# Patient Record
Sex: Female | Born: 1985 | Race: White | Hispanic: No | Marital: Married | Smoking: Former smoker
Health system: Southern US, Community
[De-identification: ages and names within clinical notes are randomized; demographics above are authoritative.]

## PROBLEM LIST (undated history)

## (undated) DIAGNOSIS — Z789 Other specified health status: Secondary | ICD-10-CM

## (undated) HISTORY — DX: Other specified health status: Z78.9

## (undated) HISTORY — PX: NO PAST SURGERIES: SHX2092

---

## 2015-12-03 ENCOUNTER — Encounter: Payer: Self-pay | Admitting: *Deleted

## 2015-12-04 ENCOUNTER — Encounter: Payer: Self-pay | Admitting: *Deleted

## 2015-12-13 ENCOUNTER — Encounter: Payer: Self-pay | Admitting: Family

## 2015-12-13 ENCOUNTER — Ambulatory Visit (INDEPENDENT_AMBULATORY_CARE_PROVIDER_SITE_OTHER): Admitting: Family

## 2015-12-13 VITALS — BP 120/74 | HR 75 | Ht 65.0 in | Wt 195.0 lb

## 2015-12-13 DIAGNOSIS — Z113 Encounter for screening for infections with a predominantly sexual mode of transmission: Secondary | ICD-10-CM

## 2015-12-13 DIAGNOSIS — Z3483 Encounter for supervision of other normal pregnancy, third trimester: Secondary | ICD-10-CM

## 2015-12-13 DIAGNOSIS — Z3493 Encounter for supervision of normal pregnancy, unspecified, third trimester: Secondary | ICD-10-CM

## 2015-12-13 DIAGNOSIS — Z363 Encounter for antenatal screening for malformations: Secondary | ICD-10-CM

## 2015-12-13 DIAGNOSIS — Z3480 Encounter for supervision of other normal pregnancy, unspecified trimester: Secondary | ICD-10-CM

## 2015-12-13 DIAGNOSIS — Z3490 Encounter for supervision of normal pregnancy, unspecified, unspecified trimester: Secondary | ICD-10-CM | POA: Insufficient documentation

## 2015-12-13 LAB — POCT URINALYSIS DIP (DEVICE)
BILIRUBIN URINE: NEGATIVE
Glucose, UA: NEGATIVE mg/dL
HGB URINE DIPSTICK: NEGATIVE
KETONES UR: 15 mg/dL — AB
Nitrite: NEGATIVE
Protein, ur: NEGATIVE mg/dL
SPECIFIC GRAVITY, URINE: 1.015 (ref 1.005–1.030)
Urobilinogen, UA: 0.2 mg/dL (ref 0.0–1.0)
pH: 7 (ref 5.0–8.0)

## 2015-12-13 LAB — OB RESULTS CONSOLE GBS: STREP GROUP B AG: NEGATIVE

## 2015-12-13 NOTE — Addendum Note (Signed)
Addended by: Garret ReddishBARNES, Meghen Akopyan M on: 12/13/2015 03:53 PM   Modules accepted: Orders

## 2015-12-13 NOTE — Patient Instructions (Addendum)
AREA PED[facility]ATRIC/FAMILY [place] [address], [address], [place]  [address] Phone - [phone]   Fax - [phone]  [facility] [address] [place], [place] [address] Phone - [phone]   Fax - [place] [address] [place], [place]  [address] Phone - [phone]   Fax - [phone]  [place] [address] [place] [address] [place], [place]  [address] Phone - [phone]   Fax - [phone]  [place] [address] [place], [place]  [address] Phone - [phone]   Fax - [phone]  [facility] [address], [address] [place], [place]  [address] Phone - [phone]   Fax - [place] [address] [place], [place]  [address] Phone - [phone]   Fax - [phone]  [facility] [address] [place], [place] [address] [place], [place]  [address] Phone - [phone]   Fax - [place] [address] [place], [place]  [address] Phone - [phone]   Fax - [phone] [facility] [place]. [address] [place], [place]  [address] Phone - [phone]   Fax - [phone]  [place] [address] [place], [place]  [address] Phone - [phone]   Fax - [phone]  [facility] [place], [place], [place]  [address] Phone - [phone]   Fax - [place] [address], [place], [place]  [address] Phone - [phone]   Fax - [phone]  [facility] [address], [place], [place]  [address] Phone - [place], [place]  [address] Phone - [phone]   Fax - [place] [address] [place], [place]  [address] Phone - [phone]   Fax - [phone]  [place] [address] [place], [place]  [address] Phone - [phone]   Fax - [place]. [place], [place]  [address] Phone - [phone]   Fax - [place], [place], [place]  [address] Phone - [phone]   Fax - [place] [address], [place], [place]  [address] Phone - [phone]   Fax - [phone]  [facility] [address], [place], [place]  [address] Phone - [phone]   Fax - [place] [address] [place], [place]  [address] Phone - [phone]   Fax - [phone]  [place] [address] [place], [place]  [address] Phone - [phone]   Fax - [phone] [place] [address] [place], [place]  [address] Phone - [phone]   Fax - [phone]  [place] [address] [place], [place]  [address] Phone - [phone]   Fax - [place] [address] [place],   [address] Phone - [phone]   Fax - [phone]  Thinking About Doren Custard???  You must attend a Doren Custard class at [facility]  3rd Wednesday of every month from 7-9pm  Free  Register by calling [phone] or online at VFederal.at  Bring  [place] the certificate from the class  Waterbirth supplies needed for [facility] patients:  Our practice has a Heritage manager in a Box tub at the hospital that you can borrow  You will need to purchase an accessory kit that has all needed supplies through [facility] [phone]) or online $175.00, will need to pay for liner separately   Or you can purchase the supplies  separately: o Single-use disposable tub liner for Birth Pool in a Box (REGULAR size) o New garden hose labeled "lead-free", "suitable for drinking water", o Electric drain pump to remove water (We recommend 792 gallon per hour or greater pump.)  o  "non-toxic" OR "water potable" o Garden hose to remove the dirty water o Fish net o Bathing suit top (optional) o Long-handled mirror (optional)  [employer] sells tubs for ~ $120 if you would rather purchase your own tub.  They also sell accessories, liners.    Www.waterbirthsolutions.com for tub purchases and supplies  The Labor Ladies (www.thelaborladies.com) $275 for tub rental/set-up & take down/kit   [employer] information regarding doulas (labor support) who provide pool rentals:  IdentityList.se.htm   The Labor Ladies (www.thelaborladies.com)  IdentityList.se.htm   Things that would prevent you from having a waterbirth:  Premature, <37wks  Previous cesarean birth  Presence of thick meconium-stained fluid  Multiple gestation (Twins, triplets, etc.)  Uncontrolled diabetes or gestational diabetes requiring medication  Hypertension  Heavy vaginal bleeding  Non-reassuring fetal heart rate  Active infection (MRSA, etc.)  If your labor has to be induced and induction method requires continuous monitoring of the baby's heart rate  Other risks/issues identified by your obstetrical provider    Third Trimester of Pregnancy The third trimester is from week 29 through week 40 (months 7 through 9). The third trimester is a time when the unborn baby (fetus) is growing rapidly. At the end of the ninth month, the fetus is about 20 inches in length and weighs 6-10 pounds. Body changes during your third trimester Your body goes through many changes during pregnancy. The changes vary from woman to woman. During the third trimester:  Your weight will continue to increase. You  can expect to gain 25-35 pounds (11-16 kg) by the end of the pregnancy.  You may begin to get stretch marks on your hips, abdomen, and breasts.  You may urinate more often because the fetus is moving lower into your pelvis and pressing on your bladder.  You may develop or continue to have heartburn. This is caused by increased hormones that slow down muscles in the digestive tract.  You may develop or continue to have constipation because increased hormones slow digestion and cause the muscles that push waste through your intestines to relax.  You may develop hemorrhoids. These are swollen veins (varicose veins) in the rectum that can itch or be painful.  You may develop swollen, bulging veins (varicose veins) in your legs.  You may have increased body aches in the pelvis, back, or thighs. This is due to weight gain and increased hormones that are relaxing your joints.  You may have changes in your hair. These can include thickening of your hair, rapid growth, and changes in texture. Some women also have hair loss during or after pregnancy, or hair that feels dry or thin. Your hair will most likely return to normal after your baby is born.  Your breasts will continue to grow and they will continue to become tender. A yellow fluid (colostrum) may  leak from your breasts. This is the first milk you are producing for your baby.  Your belly button may stick out.  You may notice more swelling in your hands, face, or ankles.  You may have increased tingling or numbness in your hands, arms, and legs. The skin on your belly may also feel numb.  You may feel short of breath because of your expanding uterus.  You may have more problems sleeping. This can be caused by the size of your belly, increased need to urinate, and an increase in your body's metabolism.  You may notice the fetus "dropping," or moving lower in your abdomen.  You may have increased vaginal discharge.  Your cervix becomes  thin and soft (effaced) near your due date. What to expect at prenatal visits You will have prenatal exams every 2 weeks until week 36. Then you will have weekly prenatal exams. During a routine prenatal visit:  You will be weighed to make sure you and the fetus are growing normally.  Your blood pressure will be taken.  Your abdomen will be measured to track your baby's growth.  The fetal heartbeat will be listened to.  Any test results from the previous visit will be discussed.  You may have a cervical check near your due date to see if you have effaced. At around 36 weeks, your health care provider will check your cervix. At the same time, your health care provider will also perform a test on the secretions of the vaginal tissue. This test is to determine if a type of bacteria, Group B streptococcus, is present. Your health care provider will explain this further. Your health care provider may ask you:  What your birth plan is.  How you are feeling.  If you are feeling the baby move.  If you have had any abnormal symptoms, such as leaking fluid, bleeding, severe headaches, or abdominal cramping.  If you are using any tobacco products, including cigarettes, chewing tobacco, and electronic cigarettes.  If you have any questions. Other tests or screenings that may be performed during your third trimester include:  Blood tests that check for low iron levels (anemia).  Fetal testing to check the health, activity level, and growth of the fetus. Testing is done if you have certain medical conditions or if there are problems during the pregnancy.  Nonstress test (NST). This test checks the health of your baby to make sure there are no signs of problems, such as the baby not getting enough oxygen. During this test, a belt is placed around your belly. The baby is made to move, and its heart rate is monitored during movement. What is false labor? False labor is a condition in which you  feel small, irregular tightenings of the muscles in the womb (contractions) that eventually go away. These are called Braxton Hicks contractions. Contractions may last for hours, days, or even weeks before true labor sets in. If contractions come at regular intervals, become more frequent, increase in intensity, or become painful, you should see your health care provider. What are the signs of labor?  Abdominal cramps.  Regular contractions that start at 10 minutes apart and become stronger and more frequent with time.  Contractions that start on the top of the uterus and spread down to the lower abdomen and back.  Increased pelvic pressure and dull back pain.  A watery or bloody mucus discharge that comes from the vagina.  Leaking of amniotic fluid. This is also known as  your "water breaking." It could be a slow trickle or a gush. Let your doctor know if it has a color or strange odor. If you have any of these signs, call your health care provider right away, even if it is before your due date. Follow these instructions at home: Eating and drinking  Continue to eat regular, healthy meals.  Do not eat:  Raw meat or meat spreads.  Unpasteurized milk or cheese.  Unpasteurized juice.  Store-made salad.  Refrigerated smoked seafood.  Hot dogs or deli meat, unless they are piping hot.  More than 6 ounces of albacore tuna a week.  Shark, swordfish, king mackerel, or tile fish.  Store-made salads.  Raw sprouts, such as mung bean or alfalfa sprouts.  Take prenatal vitamins as told by your health care provider.  Take 1000 mg of calcium daily as told by your health care provider.  If you develop constipation:  Take over-the-counter or prescription medicines.  Drink enough fluid to keep your urine clear or pale yellow.  Eat foods that are high in fiber, such as fresh fruits and vegetables, whole grains, and beans.  Limit foods that are high in fat and processed sugars, such  as fried and sweet foods. Activity  Exercise only as directed by your health care provider. Healthy pregnant women should aim for 2 hours and 30 minutes of moderate exercise per week. If you experience any pain or discomfort while exercising, stop.  Avoid heavy lifting.  Do not exercise in extreme heat or humidity, or at high altitudes.  Wear low-heel, comfortable shoes.  Practice good posture.  Do not travel far distances unless it is absolutely necessary and only with the approval of your health care provider.  Wear your seat belt at all times while in a car, on a bus, or on a plane.  Take frequent breaks and rest with your legs elevated if you have leg cramps or low back pain.  Do not use hot tubs, steam rooms, or saunas.  You may continue to have sex unless your health care provider tells you otherwise. Lifestyle  Do not use any products that contain nicotine or tobacco, such as cigarettes and e-cigarettes. If you need help quitting, ask your health care provider.  Do not drink alcohol.  Do not use any medicinal herbs or unprescribed drugs. These chemicals affect the formation and growth of the baby.  If you develop varicose veins:  Wear support pantyhose or compression stockings as told by your healthcare provider.  Elevate your feet for 15 minutes, 3-4 times a day.  Wear a supportive maternity bra to help with breast tenderness. General instructions  Take over-the-counter and prescription medicines only as told by your health care provider. There are medicines that are either safe or unsafe to take during pregnancy.  Take warm sitz baths to soothe any pain or discomfort caused by hemorrhoids. Use hemorrhoid cream or witch hazel if your health care provider approves.  Avoid cat litter boxes and soil used by cats. These carry germs that can cause birth defects in the baby. If you have a cat, ask someone to clean the litter box for you.  To prepare for the arrival of  your baby:  Take prenatal classes to understand, practice, and ask questions about the labor and delivery.  Make a trial run to the hospital.  Visit the hospital and tour the maternity area.  Arrange for maternity or paternity leave through employers.  Arrange for family and friends to  take care of pets while you are in the hospital.  Purchase a rear-facing car seat and make sure you know how to install it in your car.  Pack your hospital bag.  Prepare the baby's nursery. Make sure to remove all pillows and stuffed animals from the baby's crib to prevent suffocation.  Visit your dentist if you have not gone during your pregnancy. Use a soft toothbrush to brush your teeth and be gentle when you floss.  Keep all prenatal follow-up visits as told by your health care provider. This is important. Contact a health care provider if:  You are unsure if you are in labor or if your water has broken.  You become dizzy.  You have mild pelvic cramps, pelvic pressure, or nagging pain in your abdominal area.  You have lower back pain.  You have persistent nausea, vomiting, or diarrhea.  You have an unusual or bad smelling vaginal discharge.  You have pain when you urinate. Get help right away if:  You have a fever.  You are leaking fluid from your vagina.  You have spotting or bleeding from your vagina.  You have severe abdominal pain or cramping.  You have rapid weight loss or weight gain.  You have shortness of breath with chest pain.  You notice sudden or extreme swelling of your face, hands, ankles, feet, or legs.  Your baby makes fewer than 10 movements in 2 hours.  You have severe headaches that do not go away with medicine.  You have vision changes. Summary  The third trimester is from week 29 through week 40, months 7 through 9. The third trimester is a time when the unborn baby (fetus) is growing rapidly.  During the third trimester, your discomfort may increase  as you and your baby continue to gain weight. You may have abdominal, leg, and back pain, sleeping problems, and an increased need to urinate.  During the third trimester your breasts will keep growing and they will continue to become tender. A yellow fluid (colostrum) may leak from your breasts. This is the first milk you are producing for your baby.  False labor is a condition in which you feel small, irregular tightenings of the muscles in the womb (contractions) that eventually go away. These are called Braxton Hicks contractions. Contractions may last for hours, days, or even weeks before true labor sets in.  Signs of labor can include: abdominal cramps; regular contractions that start at 10 minutes apart and become stronger and more frequent with time; watery or bloody mucus discharge that comes from the vagina; increased pelvic pressure and dull back pain; and leaking of amniotic fluid. This information is not intended to replace advice given to you by your health care provider. Make sure you discuss any questions you have with your health care provider. Document Released: 12/17/2000 Document Revised: 05/31/2015 Document Reviewed: 02/24/2012 Elsevier Interactive Patient Education  2017 [employer].

## 2015-12-13 NOTE — Progress Notes (Signed)
  Subjective:    Mary Vaughan is a G2P1001 9862w1d being seen today for her first obstetrical visit.  Pt recently moved here five [place] ago [place] Vaugh[place] and Compan[place]ilitary family.  [place], brought prenatal book with her.  Able to decipher some of the results.  Reports all labs and ultrasounds were normal.  Desires waterbirth.  Here living with in-laws.  Husband still on assignment with hopes of returning for three weeks around the birth.  Her obstetrical history is significant for one normal NSVD at term. Patient does intend to breast feed. Breastfed son x 15 months.  Pregnancy history fully reviewed.  Patient reports no complaints.  Vitals:   12/13/15 1329 12/13/15 1332  BP: 120/74   Pulse: 75   Weight: 195 lb (88.5 kg)   Height:  5\' 5"  (1.651 m)    HISTORY: OB History  Gravida Para Term Preterm AB Living  2 1 1     1   SAB TAB Ectopic Multiple Live Births          1    # Outcome Date GA Lbr Len/2nd Weight Sex Delivery Anes PTL Lv  2 Current           1 Term 2014 3882w0d  8 lb 5 oz (3.771 kg) M Vag-Spont EPI N LIV     Past Medical History:  Diagnosis Date  . Medical history non-contributory    Past Surgical History:  Procedure Laterality Date  . NO PAST SURGERIES     Family History  Problem Relation Age of Onset  . Cancer Father   . Cancer Paternal Aunt   . Cancer Maternal Grandmother   . Cancer Paternal Grandmother   . Cancer Paternal Grandfather      Exam  Fundal height 36; FHR 122 BP 120/74   Pulse 75   Ht 5\' 5"  (1.651 m)   Wt 195 lb (88.5 kg)   BMI 32.45 kg/m  Uterine Size: size equals dates  Pelvic Exam:    Perineum: No Hemorrhoids, Normal Perineum   Vulva: normal   Vagina:  normal mucosa, normal discharge, no palpable nodules   pH: Not done   Cervix: no bleeding following Pap, no cervical motion tenderness and no lesions   Adnexa: normal adnexa and no mass, fullness, tenderness   Bony Pelvis: Adequate   Skin: normal coloration and  turgor, no rashes    Neurologic: negative   Extremities: normal strength, tone, and muscle mass   HEENT neck supple with midline trachea and thyroid without masses   Mouth/Teeth mucous membranes moist, pharynx normal without lesions   Neck supple and no masses   Cardiovascular: regular rate and rhythm, no murmurs or gallops   Respiratory:  appears well, vitals normal, no respiratory distress, acyanotic, normal RR, neck free of mass or lymphadenopathy, chest clear, no wheezing, crepitations, rhonchi, normal symmetric air entry   Abdomen: soft, non-tender; bowel sounds normal; no masses,  no organomegaly   Urinary: urethral meatus normal      Assessment:    Pregnancy: G2P1001 Patient Active Problem List   Diagnosis Date Noted  . Encounter for supervision of normal pregnancy, antepartum 12/13/2015     Plan:     Initial labs drawn, with hgbA1c added.  Pap smear obtained.  GBS also collected.   Prenatal vitamins. Problem list reviewed and updated.  Ultrasound discussed; fetal survey: ordered.  Follow up in 1 weeks.   Marlis EdelsonKARIM, [name] 12/13/2015

## 2015-12-14 LAB — PRENATAL PROFILE (SOLSTAS)
ANTIBODY SCREEN: NEGATIVE
Basophils Absolute: 0 cells/uL (ref 0–200)
Basophils Relative: 0 %
EOS PCT: 1 %
Eosinophils Absolute: 102 cells/uL (ref 15–500)
HEMATOCRIT: 37.7 % (ref 35.0–45.0)
HEMOGLOBIN: 12.3 g/dL (ref 11.7–15.5)
HEP B S AG: NEGATIVE
HIV 1&2 Ab, 4th Generation: NONREACTIVE
LYMPHS ABS: 1938 {cells}/uL (ref 850–3900)
LYMPHS PCT: 19 %
MCH: 29.5 pg (ref 27.0–33.0)
MCHC: 32.6 g/dL (ref 32.0–36.0)
MCV: 90.4 fL (ref 80.0–100.0)
MONOS PCT: 5 %
MPV: 10.5 fL (ref 7.5–12.5)
Monocytes Absolute: 510 cells/uL (ref 200–950)
Neutro Abs: 7650 cells/uL (ref 1500–7800)
Neutrophils Relative %: 75 %
Platelets: 244 10*3/uL (ref 140–400)
RBC: 4.17 MIL/uL (ref 3.80–5.10)
RDW: 13.3 % (ref 11.0–15.0)
RH TYPE: POSITIVE
Rubella: 6.15 Index — ABNORMAL HIGH (ref <0.90)
WBC: 10.2 10*3/uL (ref 3.8–10.8)

## 2015-12-14 LAB — HEMOGLOBIN A1C
Hgb A1c MFr Bld: 5.2 % (ref <5.7)
MEAN PLASMA GLUCOSE: 103 mg/dL

## 2015-12-14 LAB — GC/CHLAMYDIA PROBE AMP (~~LOC~~) NOT AT ARMC
Chlamydia: NEGATIVE
Neisseria Gonorrhea: NEGATIVE

## 2015-12-15 LAB — CULTURE, STREPTOCOCCUS GRP B W/SUSCEPT

## 2015-12-19 LAB — CYTOLOGY - PAP
DIAGNOSIS: NEGATIVE
HPV: DETECTED — AB

## 2015-12-20 ENCOUNTER — Ambulatory Visit (INDEPENDENT_AMBULATORY_CARE_PROVIDER_SITE_OTHER): Admitting: Family

## 2015-12-20 VITALS — BP 127/81 | HR 87 | Wt 194.5 lb

## 2015-12-20 DIAGNOSIS — Z3480 Encounter for supervision of other normal pregnancy, unspecified trimester: Secondary | ICD-10-CM

## 2015-12-20 DIAGNOSIS — Z3483 Encounter for supervision of other normal pregnancy, third trimester: Secondary | ICD-10-CM

## 2015-12-23 NOTE — Progress Notes (Signed)
   PRENATAL VISIT NOTE  Subjective:  Mary Vaughan is a 30 y.o. G2P1001 at 7260w4d being seen today for ongoing prenatal care.  She is currently monitored for the following issues for this low-risk pregnancy and has Encounter for supervision of normal pregnancy, antepartum on her problem list.  Patient reports feeling increased anxiety and sadness due to everything happening (ie recent move and husband being away)..Contractions: Irritability. Vag. Bleeding: None.  Movement: Present. Denies leaking of fluid. Desires waterbirth attended class and brought certificate.  May hire a doula.    The following portions of the patient's history were reviewed and updated as appropriate: allergies, current medications, past family history, past medical history, past social history, past surgical history and problem list. Problem list updated.  Objective:   Vitals:   12/20/15 1024  BP: 127/81  Pulse: 87  Weight: 194 lb 8 oz (88.2 kg)    Fetal Status: Fetal Heart Rate (bpm): 126 Fundal Height: 37 cm Movement: Present  Presentation: Vertex  General:  Alert, oriented and cooperative. Patient is in no acute distress.  Tearful while talking about current living situation (misses husband)  Skin: Skin is warm and dry. No rash noted.   Cardiovascular: Normal heart rate noted  Respiratory: Normal respiratory effort, no problems with respiration noted  Abdomen: Soft, gravid, appropriate for gestational age. Pain/Pressure: Present     Pelvic:  Cervical exam performed Dilation: 3.5 Effacement (%): Thick Station: -3  Extremities: Normal range of motion.  Edema: None  Mental Status: Normal mood and affect. Normal behavior. Normal judgment and thought content.   Assessment and Plan:  Pregnancy: G2P1001 at 8660w4d  1. Supervision of other normal pregnancy, antepartum - Signed waterbirth consents today and reviewed contraindications if developed later in pregnancy including the following:   thick, particulate  meconium stained fluid, Maternal fever over 101, heavy bleeding or signs of placental abruption, pre-eclampsia, abnormal fetal heart rate pattern, breech presentation, active communicable infection (this does NOT include group B strep), significant limitation to mobility, or any other condition per provider discretion. - Explained must have someone there to help set-up, considering a doula  2. Emotional Concerns - Unable to see [name] today, will reschedule for next week - Emotions improved during visits and once cervix was checked (concerned husband was going to miss birth)  Term labor symptoms and general obstetric precautions including but not limited to vaginal bleeding, contractions, leaking of fluid and fetal movement were reviewed in detail with the patient. Please refer to After Visit Summary for other counseling recommendations.  Return in about 1 week (around 12/27/2015) for appt with provider and [name].   Eino FarberWalidah Kennith GainN Karim, CNM

## 2015-12-24 ENCOUNTER — Ambulatory Visit (INDEPENDENT_AMBULATORY_CARE_PROVIDER_SITE_OTHER): Admitting: Clinical

## 2015-12-24 DIAGNOSIS — F4323 Adjustment disorder with mixed anxiety and depressed mood: Secondary | ICD-10-CM

## 2015-12-24 NOTE — BH Specialist Note (Signed)
Session Start time: 10:35   End Time: 11:35  Total Time:  60 minutes Type of Service: Behavioral Health - Individual/Family Interpreter: No.   Interpreter Name & Language: n/a # [facility]  SUBJECTIVE: Mary Vaughan is a 30 y.o. female  Pt. was referred by Rochele PagesWalidah Karim, CNM for:  anxiety. Pt. reports the following symptoms/concerns: Pt states that she did not experience any postpartum mood issues after birth of 30yo; attributes symptoms of anxiety(fearful, anxious, irritable) to numerous recent life stressors, including move from [place](where [place] is stationed) to [place]where parents live, after fathers terminal cancer diagnosis) to [place], to live with husband's parents temporarily. Pt planned home water birth in [place], and nervous about birth in hospital. Duration of problem:  Over one month Severity: mild Previous treatment: none  OBJECTIVE: Mood: Anxious & Affect: Appropriate Risk of harm to self or others: No known risk of harm to self or others Assessments administered: PHQ9: 6/ GAD7: 8  LIFE CONTEXT:  Family & Social: Living temporarily with mother-in-law and father-in-law, 3yo son. Husband stationed in [place]. Her family lives in [place]. 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  Pt may benefit from psychoeducation and brief therapeutic interventions regarding coping with symptoms of anxiety.   PLAN: 1. F/U with behavioral health clinician: At postpartum visit, or earlier, as needed 2. Behavioral Health meds: none 3.  Behavioral recommendations:  -Set aside time daily for implementing "Worry Hour" strategy, for prioritizing life stressors -Use [facility] Postpartum Planner to initiate discussion with in-laws about kinds of help that may be needed postpartum -Read educational materials regarding coping with anxiety -Try out apps, as discussed in office visit, as additional self-care -Consider [facility] for additional community resources available locallyy  4. Referral: Brief Counseling/Psychotherapy, Publishing rights managerCommunity Resource and Psychoeducation 5. From scale of 1-10, how likely are you to follow plan: 9  Rae LipsJamie C Duru Reiger LCSWA Behavioral Health Clinician  Warmhandoff: no

## 2015-12-28 ENCOUNTER — Encounter: Payer: Self-pay | Admitting: *Deleted

## 2015-12-28 ENCOUNTER — Ambulatory Visit (HOSPITAL_COMMUNITY)
Admission: RE | Admit: 2015-12-28 | Discharge: 2015-12-28 | Disposition: A | Discharge status: Home / Self Care | Source: Ambulatory Visit | Attending: Family | Admitting: Family

## 2015-12-28 DIAGNOSIS — Z3493 Encounter for supervision of normal pregnancy, unspecified, third trimester: Secondary | ICD-10-CM

## 2015-12-28 DIAGNOSIS — Z3A38 38 weeks gestation of pregnancy: Secondary | ICD-10-CM | POA: Diagnosis not present

## 2015-12-28 DIAGNOSIS — Z363 Encounter for antenatal screening for malformations: Secondary | ICD-10-CM | POA: Diagnosis not present

## 2015-12-31 ENCOUNTER — Encounter: Payer: Self-pay | Admitting: Family

## 2015-12-31 DIAGNOSIS — O3660X0 Maternal care for excessive fetal growth, unspecified trimester, not applicable or unspecified: Secondary | ICD-10-CM | POA: Insufficient documentation

## 2016-01-01 ENCOUNTER — Ambulatory Visit (INDEPENDENT_AMBULATORY_CARE_PROVIDER_SITE_OTHER): Admitting: Student

## 2016-01-01 DIAGNOSIS — Z3480 Encounter for supervision of other normal pregnancy, unspecified trimester: Secondary | ICD-10-CM

## 2016-01-01 DIAGNOSIS — Z3483 Encounter for supervision of other normal pregnancy, third trimester: Secondary | ICD-10-CM | POA: Diagnosis not present

## 2016-01-01 NOTE — Patient Instructions (Signed)
Braxton Hicks Contractions °Contractions of the uterus can occur throughout pregnancy. Contractions are not always a sign that you are in labor.  °WHAT ARE BRAXTON HICKS CONTRACTIONS?  °Contractions that occur before labor are called Braxton Hicks contractions, or false labor. Toward the end of pregnancy (32-34 weeks), these contractions can develop more often and may become more forceful. This is not true labor because these contractions do not result in opening (dilatation) and thinning of the cervix. They are sometimes difficult to tell apart from true labor because these contractions can be forceful and people have different pain tolerances. You should not feel embarrassed if you go to the hospital with false labor. Sometimes, the only way to tell if you are in true labor is for your health care provider to look for changes in the cervix. °If there are no prenatal problems or other health problems associated with the pregnancy, it is completely safe to be sent home with false labor and await the onset of true labor. °HOW CAN YOU TELL THE DIFFERENCE BETWEEN TRUE AND FALSE LABOR? °False Labor  °· The contractions of false labor are usually shorter and not as hard as those of true labor.   °· The contractions are usually irregular.   °· The contractions are often felt in the front of the lower abdomen and in the groin.   °· The contractions may go away when you walk around or change positions while lying down.   °· The contractions get weaker and are shorter lasting as time goes on.   °· The contractions do not usually become progressively stronger, regular, and closer together as with true labor.   °True Labor  °· Contractions in true labor last 30-70 seconds, become very regular, usually become more intense, and increase in frequency.   °· The contractions do not go away with walking.   °· The discomfort is usually felt in the top of the uterus and spreads to the lower abdomen and low back.   °· True labor can be  determined by your health care provider with an exam. This will show that the cervix is dilating and getting thinner.   °WHAT TO REMEMBER °· Keep up with your usual exercises and follow other instructions given by your health care provider.   °· Take medicines as directed by your health care provider.   °· Keep your regular prenatal appointments.   °· Eat and drink lightly if you think you are going into labor.   °· If Braxton Hicks contractions are making you uncomfortable:   °¨ Change your position from lying down or resting to walking, or from walking to resting.   °¨ Sit and rest in a tub of warm water.   °¨ Drink 2-3 glasses of water. Dehydration may cause these contractions.   °¨ Do slow and deep breathing several times an hour.   °WHEN SHOULD I SEEK IMMEDIATE MEDICAL CARE? °Seek immediate medical care if: °· Your contractions become stronger, more regular, and closer together.   °· You have fluid leaking or gushing from your vagina.   °· You have a fever.   °· You pass blood-tinged mucus.   °· You have vaginal bleeding.   °· You have continuous abdominal pain.   °· You have low back pain that you never had before.   °· You feel your baby's head pushing down and causing pelvic pressure.   °· Your baby is not moving as much as it used to.   °This information is not intended to replace advice given to you by your health care provider. Make sure you discuss any questions you have with your health care   provider. °Document Released: 12/23/2004 Document Revised: 04/16/2015 Document Reviewed: 10/04/2012 °Elsevier Interactive Patient Education © 2017 [employer] Inc. ° °

## 2016-01-01 NOTE — Progress Notes (Signed)
   PRENATAL VISIT NOTE  Subjective:  Mary Vaughan is a 30 y.o. G2P1001 at 4523w6d being seen today for ongoing prenatal care.  She is currently monitored for the following issues for this low-risk pregnancy and has Encounter for supervision of normal pregnancy, antepartum and Large for gestational age fetus affecting management of mother, antepartum on her problem list.  Patient reports no complaints.  Contractions: Irritability. Vag. Bleeding: None.  Movement: Present. Denies leaking of fluid.   The following portions of the patient's history were reviewed and updated as appropriate: allergies, current medications, past family history, past medical history, past social history, past surgical history and problem list. Problem list updated.  Objective:   Vitals:   01/01/16 0745  BP: 113/65  Pulse: 79  Temp: 97.8 F (36.6 C)  Weight: 89.3 kg (196 lb 14.4 oz)    Fetal Status: Fetal Heart Rate (bpm): 135   Movement: Present     General:  Alert, oriented and cooperative. Patient is in no acute distress.  Skin: Skin is warm and dry. No rash noted.   Cardiovascular: Normal heart rate noted  Respiratory: Normal respiratory effort, no problems with respiration noted  Abdomen: Soft, gravid, appropriate for gestational age. Pain/Pressure: Present     Pelvic:  Cervical exam performed        Extremities: Normal range of motion.  Edema: None  Mental Status: Normal mood and affect. Normal behavior. Normal judgment and thought content.   Assessment and Plan:  Pregnancy: G2P1001 at 7523w6d. Doing well; anxious to have the baby because her husband has to go back to [place] on the 3 of January.   Supervision of other normal pregnancy, antepartum    Term labor symptoms and general obstetric precautions including but not limited to vaginal bleeding, contractions, leaking of fluid and fetal movement were reviewed in detail with the patient. Please refer to After Visit Summary for other counseling  recommendations.  Return in about 1 week (around 01/08/2016), or OB follow up.   Marylene LandKathryn Lorraine Shelsie Tijerino, CNM

## 2016-01-07 ENCOUNTER — Inpatient Hospital Stay (HOSPITAL_COMMUNITY)
Admission: AD | Admit: 2016-01-07 | Discharge: 2016-01-09 | DRG: 775 | Disposition: A | Discharge status: Home / Self Care | Source: Ambulatory Visit | Attending: Obstetrics and Gynecology | Admitting: Obstetrics and Gynecology

## 2016-01-07 DIAGNOSIS — O326XX0 Maternal care for compound presentation, not applicable or unspecified: Secondary | ICD-10-CM | POA: Diagnosis present

## 2016-01-07 DIAGNOSIS — Z3A39 39 weeks gestation of pregnancy: Secondary | ICD-10-CM

## 2016-01-07 DIAGNOSIS — Z3490 Encounter for supervision of normal pregnancy, unspecified, unspecified trimester: Secondary | ICD-10-CM

## 2016-01-07 DIAGNOSIS — Z3493 Encounter for supervision of normal pregnancy, unspecified, third trimester: Secondary | ICD-10-CM | POA: Diagnosis present

## 2016-01-07 DIAGNOSIS — Z87891 Personal history of nicotine dependence: Secondary | ICD-10-CM | POA: Diagnosis not present

## 2016-01-07 LAB — CBC
HCT: 38.7 % (ref 36.0–46.0)
HEMOGLOBIN: 13.1 g/dL (ref 12.0–15.0)
MCH: 29.8 pg (ref 26.0–34.0)
MCHC: 33.9 g/dL (ref 30.0–36.0)
MCV: 88.2 fL (ref 78.0–100.0)
Platelets: 224 10*3/uL (ref 150–400)
RBC: 4.39 MIL/uL (ref 3.87–5.11)
RDW: 13.3 % (ref 11.5–15.5)
WBC: 10.4 10*3/uL (ref 4.0–10.5)

## 2016-01-07 LAB — TYPE AND SCREEN
ABO/RH(D): B POS
ANTIBODY SCREEN: NEGATIVE

## 2016-01-07 MED ORDER — SOD CITRATE-CITRIC ACID 500-334 MG/5ML PO SOLN: 30.0000 mL | ORAL | Status: DC | PRN

## 2016-01-07 MED ORDER — MISOPROSTOL 200 MCG PO TABS
ORAL_TABLET | ORAL | Status: AC
  Filled 2016-01-07: qty 4

## 2016-01-07 MED ORDER — OXYTOCIN 10 UNIT/ML IJ SOLN
INTRAMUSCULAR | Status: AC
  Administered 2016-01-07: 10 [IU] via INTRAMUSCULAR
  Filled 2016-01-07: qty 1

## 2016-01-07 MED ORDER — OXYCODONE-ACETAMINOPHEN 5-325 MG PO TABS: 1.0000 | ORAL_TABLET | ORAL | Status: DC | PRN

## 2016-01-07 MED ORDER — LIDOCAINE HCL (PF) 1 % IJ SOLN: 30.0000 mL | INTRAMUSCULAR | Status: DC | PRN

## 2016-01-07 MED ORDER — MISOPROSTOL 200 MCG PO TABS
800.0000 ug | ORAL_TABLET | Freq: Once | ORAL | Status: AC
  Administered 2016-01-07: 800 ug via RECTAL

## 2016-01-07 MED ORDER — ONDANSETRON HCL 4 MG/2ML IJ SOLN: 4.0000 mg | Freq: Four times a day (QID) | INTRAMUSCULAR | Status: DC | PRN

## 2016-01-07 MED ORDER — OXYCODONE-ACETAMINOPHEN 5-325 MG PO TABS
2.0000 | ORAL_TABLET | ORAL | Status: DC | PRN
  Administered 2016-01-07: 2 via ORAL
  Filled 2016-01-07: qty 2

## 2016-01-07 MED ORDER — ACETAMINOPHEN 325 MG PO TABS: 650.0000 mg | ORAL_TABLET | ORAL | Status: DC | PRN

## 2016-01-07 NOTE — L&D Delivery Note (Signed)
Mary Vaughan is a 31 y.o. G2P1001 at 8648w5d who presented in active labor. She progressed normally to a NSVD via waterbirth. Compound right hand noted, and shoulder cord x one. Infant brought to mother's chest with spontaneous cry. Apgars 8/9. Cord clamped and cut after pulsations ceased. Mother brought from the tub to the bed and placenta delivered spontaneous and intact. Perineum intact.  EBL: 250 cc Repair: none Anesthesia: none  Infant and mother transferred to postpartum. Routine care.   [name], Mary Vaughan  11:22 PM 01/07/16

## 2016-01-07 NOTE — MAU Note (Signed)
Pt reports contractions at 6am, more frequent now-every 3-5 mins. Denies LOF. Some bloody show. Cervix was 3.5 on last exam.

## 2016-01-07 NOTE — H&P (Signed)
Mary Vaughan is a 31 y.o. female G2P1001 at 1030w5d presenting with contractions since 6am. Contractions are every couple minutes. Denies loss of fluid. Requests water birth. Received most of her prenatal care in [place]. [facility] 10880[facility].   OB History    Gravida Para Term Preterm AB Living   2 1 1     1    SAB TAB Ectopic Multiple Live Births           1     Past Medical History:  Diagnosis Date  . Medical history non-contributory    Past Surgical History:  Procedure Laterality Date  . NO PAST SURGERIES     Family History: family history includes Cancer in her father, maternal grandmother, paternal aunt, paternal grandfather, and paternal grandmother. Social History:  reports that she quit smoking about 5 years ago. Her smoking use included Cigarettes. She has never used smokeless tobacco. She reports that she does not drink alcohol or use drugs.    Maternal Diabetes: No Genetic Screening: Declined Maternal Ultrasounds/Referrals: Normal Fetal Ultrasounds or other Referrals:  None Maternal Substance Abuse:  No Significant Maternal Medications:  None Significant Maternal Lab Results:  Lab values include: Group B Strep negative, Other: +HR HPV Other Comments:  None  ROS Maternal Medical History:  Reason for admission: Contractions.   Contractions: Onset was 13-24 hours ago.   Frequency: regular.   Duration is approximately 2 minutes.   Perceived severity is strong.      Dilation: 5.5 Effacement (%): 80 Station: -2 Exam by:: Camelia Enganielle Simpson, RN Blood pressure 125/81, pulse 62, temperature 98.2 F (36.8 C), temperature source Oral, resp. rate 20, height 5\' 5"  (1.651 m), weight 88.9 kg (196 lb). Exam Physical Exam  Constitutional: She is oriented to person, place, and time. She appears well-developed and well-nourished.  HENT:  Head: Normocephalic.  Mouth/Throat: Oropharynx is clear and moist.  Cardiovascular: Normal rate, regular rhythm  and normal heart sounds.   Respiratory: Effort normal and breath sounds normal. No respiratory distress. She has no wheezes.  GI:  Gravid abdomen   Musculoskeletal: She exhibits no edema.  Neurological: She is alert and oriented to person, place, and time. She exhibits normal muscle tone.  Skin: Skin is warm. No rash noted.  Psychiatric: She has a normal mood and affect.    Prenatal labs: ABO, Rh: B/POS/-- (12/07 1411) Antibody: NEG (12/07 1411) Rubella: 6.15 (12/07 1411) RPR: NON REAC (12/07 1411)  HBsAg: NEGATIVE (12/07 1411)  HIV: NONREACTIVE (12/07 1411)  GBS: Negative (12/07 0000)Negative  Assessment/Plan: Mary Ahomanda Rose Heatley is a 31 y.o. female G2P1001 at 6030w5d who presents in active labor.  SVE 5.5/80/0.  Desires water birth Admit to L&D floor   Beaulah DinningChristina M Gambino 01/07/2016, 10:11 PM   I have seen the patient with the resident and agree with the above note.   [name], Heather Donovan  12:54 AM 01/08/16

## 2016-01-08 ENCOUNTER — Encounter: Admitting: Advanced Practice Midwife

## 2016-01-08 ENCOUNTER — Encounter (HOSPITAL_COMMUNITY): Payer: Self-pay

## 2016-01-08 LAB — ABO/RH: ABO/RH(D): B POS

## 2016-01-08 MED ORDER — DIBUCAINE 1 % RE OINT: 1.0000 "application " | TOPICAL_OINTMENT | RECTAL | Status: DC | PRN

## 2016-01-08 MED ORDER — TETANUS-DIPHTH-ACELL PERTUSSIS 5-2.5-18.5 LF-MCG/0.5 IM SUSP
0.5000 mL | Freq: Once | INTRAMUSCULAR | Status: DC
  Filled 2016-01-08: qty 0.5

## 2016-01-08 MED ORDER — PRENATAL MULTIVITAMIN CH
1.0000 | ORAL_TABLET | Freq: Every day | ORAL | Status: DC
  Administered 2016-01-08: 1 via ORAL
  Filled 2016-01-08: qty 1

## 2016-01-08 MED ORDER — ZOLPIDEM TARTRATE 5 MG PO TABS: 5.0000 mg | ORAL_TABLET | Freq: Every evening | ORAL | Status: DC | PRN

## 2016-01-08 MED ORDER — DIPHENHYDRAMINE HCL 25 MG PO CAPS: 25.0000 mg | ORAL_CAPSULE | Freq: Four times a day (QID) | ORAL | Status: DC | PRN

## 2016-01-08 MED ORDER — OXYCODONE-ACETAMINOPHEN 5-325 MG PO TABS: 2.0000 | ORAL_TABLET | ORAL | Status: DC | PRN

## 2016-01-08 MED ORDER — WITCH HAZEL-GLYCERIN EX PADS: 1.0000 "application " | MEDICATED_PAD | CUTANEOUS | Status: DC | PRN

## 2016-01-08 MED ORDER — SIMETHICONE 80 MG PO CHEW: 80.0000 mg | CHEWABLE_TABLET | ORAL | Status: DC | PRN

## 2016-01-08 MED ORDER — OXYCODONE-ACETAMINOPHEN 5-325 MG PO TABS
1.0000 | ORAL_TABLET | ORAL | Status: DC | PRN
Start: 2016-01-08 — End: 2016-01-09

## 2016-01-08 MED ORDER — ONDANSETRON HCL 4 MG PO TABS: 4.0000 mg | ORAL_TABLET | ORAL | Status: DC | PRN

## 2016-01-08 MED ORDER — SENNOSIDES-DOCUSATE SODIUM 8.6-50 MG PO TABS
2.0000 | ORAL_TABLET | ORAL | Status: DC
  Administered 2016-01-08 – 2016-01-09 (×2): 2 via ORAL
  Filled 2016-01-08 (×2): qty 2

## 2016-01-08 MED ORDER — COCONUT OIL OIL: 1.0000 "application " | TOPICAL_OIL | Status: DC | PRN

## 2016-01-08 MED ORDER — BENZOCAINE-MENTHOL 20-0.5 % EX AERO
1.0000 "application " | INHALATION_SPRAY | CUTANEOUS | Status: DC | PRN
  Filled 2016-01-08: qty 56

## 2016-01-08 MED ORDER — ONDANSETRON HCL 4 MG/2ML IJ SOLN: 4.0000 mg | INTRAMUSCULAR | Status: DC | PRN

## 2016-01-08 MED ORDER — ACETAMINOPHEN 325 MG PO TABS: 650.0000 mg | ORAL_TABLET | ORAL | Status: DC | PRN

## 2016-01-08 MED ORDER — IBUPROFEN 600 MG PO TABS
600.0000 mg | ORAL_TABLET | Freq: Four times a day (QID) | ORAL | Status: DC
  Administered 2016-01-08 – 2016-01-09 (×5): 600 mg via ORAL
  Filled 2016-01-08 (×5): qty 1

## 2016-01-08 NOTE — Progress Notes (Signed)
Post Partum Day 1 Subjective: no complaints, up ad lib, voiding, tolerating PO and + flatus  Objective: Blood pressure 121/62, pulse 60, temperature 99.2 F (37.3 C), temperature source Oral, resp. rate 20, height 5\' 5"  (1.651 m), weight 88.9 kg (196 lb), unknown if currently breastfeeding.  Physical Exam:  General: alert, cooperative and appears stated age Lochia: appropriate Uterine Fundus: firm DVT Evaluation: No evidence of DVT seen on physical exam. Negative Homan's sign. No significant calf/ankle edema.   Recent Labs  01/07/16 2142  HGB 13.1  HCT 38.7    Assessment/Plan: Mary Vaughan is a 31 y.o. [id number] who delivered via NSVD.   Plan for discharge tomorrow  Breastfeeding Unsure for contraception   LOS: 1 day   Mary Vaughan 01/08/2016, 6:29 AM   OB FELLOW POSTPARTUM PROGRESS NOTE ATTESTATION  I have seen and examined this patient and agree with above documentation in the resident's note.  Patient is going to move back to [place] in 1-2 months.   Jen MowElizabeth Mumaw, DO OB Fellow 01/08/16  8:56 AM

## 2016-01-08 NOTE — Lactation Note (Signed)
This note was copied from a baby's chart. Lactation Consultation Note  Patient Name: [name] ZOXWR'UToday's Date: 01/08/2016   Visited with Mom, baby 6218 hrs old.  Mom denies needing any assistance as baby is latching and feeding on both breasts well.  Encouraged STS and frequent feedings on cue.  Baby has fed 10 times in last 18 hrs. Lactation to assist prn and to follow up in am.   Judee ClaraSmith, Purva Vessell E 01/08/2016, 5:49 PM

## 2016-01-08 NOTE — Lactation Note (Signed)
This note was copied from a baby's chart. Lactation Consultation Note Experienced BF mom of 18 months to her now 693 yr old states this baby is BF great. States was sleepy for this last feeding time, but has feed great for the others.  Mom has large pendulum breast w/everted nipples. Hand expression demonstrated colostrum.  Discussed latching, nipple pain, obtaining deep latch, newborn feeding habits and behavior, I&O, supply and demand.  Encouraged STS while feeding. Mom encouraged to feed baby 8-12 times/24 hours and with feeding cues. Mom encouraged to waken baby for feeds.  [facility]/LC brochure given w/resources, support groups and LC services. Patient Name: Mary Vaughan [id number]'XToday's Date: 01/08/2016 Reason for consult: Initial assessment   Maternal Data Has patient been taught Hand Expression?: Yes Does the patient have breastfeeding experience prior to this delivery?: Yes  Feeding Feeding Type: Breast Fed Length of feed: 0 min  LATCH Score/Interventions Latch: Too sleepy or reluctant, no latch achieved, no sucking elicited. Intervention(s): Skin to skin;Teach feeding cues;Waking techniques  Audible Swallowing: None  Type of Nipple: Everted at rest and after stimulation  Comfort (Breast/Nipple): Soft / non-tender     Hold (Positioning): No assistance needed to correctly position infant at breast.  LATCH Score: 6  Lactation Tools Discussed/Used WIC Program: No   Consult Status Consult Status: Follow-up Date: 01/08/16 (in pm) Follow-up type: In-patient    Charyl DancerCARVER, [name] 01/08/2016, 5:44 AM

## 2016-01-08 NOTE — Progress Notes (Signed)
Patient ID: Mary Vaughan, female   DOB: [birth year], 31 y.o.   MRN: [id number]  Called to room by RN. RN reports that patient having "gushes of blood". On exam uterus is firm, but several small gushes and clots passed. Patient given IM pitocin and 800mcg of Cytotec rectally. She continued to have bleeding and small clots. Patient attempted to void on the the bedpan and toilet. Unable to void. Straight cath for about 500 cc of clear, yellow urine. Bleeding slowed at this time. Uterus firm. [name], Henriette Hesser Donovan  12:50 AM 01/08/16

## 2016-01-09 LAB — SYPHILIS: RPR W/REFLEX TO RPR TITER AND TREPONEMAL ANTIBODIES, TRADITIONAL SCREENING AND DIAGNOSIS ALGORITHM: RPR Ser Ql: NONREACTIVE

## 2016-01-09 MED ORDER — IBUPROFEN 600 MG PO TABS: 600.0000 mg | ORAL_TABLET | Freq: Four times a day (QID) | ORAL | 0 refills | Status: AC

## 2016-01-09 MED ORDER — ACETAMINOPHEN 325 MG PO TABS: 650.0000 mg | ORAL_TABLET | ORAL | 0 refills | Status: AC | PRN

## 2016-01-09 NOTE — Lactation Note (Signed)
This note was copied from a baby's chart. Lactation Consultation Note; infant in nursery for circumcision. Mother states infant has been breastfeeding well. She states that infant began to cluster feed during the night. Mother denies having any questions. Advised mother to continue to hand express to improve milk volume. Advised mother to do good breast massage as milk is coming in. Mother to use ice if she becomes engorged.  Mother informed of available lactation services. She will follow up as needed. Patient Name: Mary Vaughan [id number]'BToday's Date: 01/09/2016     Maternal Data    Feeding Feeding Type: Breast Fed Length of feed: 15 min  LATCH Score/Interventions                      Lactation Tools Discussed/Used     Consult Status      Michel BickersKendrick, Margarete Horace McCoy 01/09/2016, 10:09 AM

## 2016-01-09 NOTE — Discharge Summary (Signed)
OB Discharge Summary     Patient Name: Mary Vaughan DOB: 01-Jun-1985 MRN: [id number]  Date of admission: 01/07/2016 Delivering MD: Thressa Sheller D   Date of discharge: 01/09/2016  Admitting diagnosis: 39w labor, ctx varying Intrauterine pregnancy: [redacted]w[redacted]d     Secondary diagnosis:  Active Problems:   Term pregnancy  Additional problems: N/A     Discharge diagnosis: Term Pregnancy Delivered                                                                                                Post partum procedures:post partum cytotec and pitocin  Augmentation: none  Complications: None  Hospital course:  Onset of Labor With Vaginal Delivery     31 y.o. yo [id number] at [redacted]w[redacted]d was admitted in Active Labor on 01/07/2016. Patient had an uncomplicated labor course as follows:  Membrane Rupture Time/Date: 10:40 PM ,01/07/2016   Intrapartum Procedures: Episiotomy: None [1]                                         Lacerations:  None [1]  Patient had a delivery of a Viable infant. 01/07/2016  Information for the patient's newborn:  Mary Vaughan, Mary Vaughan [[id number]]       Pateint had an uncomplicated postpartum course.  She is ambulating, tolerating a regular diet, passing flatus, and urinating well. Patient is discharged home in stable condition on 01/09/16.    Physical exam  Vitals:   01/08/16 0645 01/08/16 1656 01/08/16 2110 01/09/16 0524  BP: (!) 110/53 113/63 118/62 (!) 117/51  Pulse: 67 (!) 59 68 62  Resp:   18 17  Temp: 98.2 F (36.8 C)  97.7 F (36.5 C) 98.3 F (36.8 C)  TempSrc:   Oral Oral  Weight:      Height:       General: alert, cooperative and no distress Lochia: appropriate Uterine Fundus: firm Incision: N/A DVT Evaluation: No evidence of DVT seen on physical exam. No cords or calf tenderness. No significant calf/ankle edema. Labs: Lab Results  Component Value Date   WBC 10.4 01/07/2016   HGB 13.1 01/07/2016   HCT 38.7 01/07/2016   MCV 88.2 01/07/2016   PLT 224  01/07/2016   No flowsheet data found.  Discharge instruction: per After Visit Summary and "Baby and Me Booklet".  After visit meds:  Allergies as of 01/09/2016      Reactions   Doxycycline Hives   Penicillins Hives   Has patient had a PCN reaction causing immediate rash, facial/tongue/throat swelling, SOB or lightheadedness with hypotension: No Has patient had a PCN reaction causing severe rash involving mucus membranes or skin necrosis: No Has patient had a PCN reaction that required hospitalization No Has patient had a PCN reaction occurring within the last 10 years: No If all of the above answers are "NO", then may proceed with Cephalosporin use.      Medication List    STOP taking these medications   alum hydroxide-mag trisilicate 80-20 MG Chew chewable  tablet Commonly known as:  GAVISCON     TAKE these medications   acetaminophen 325 MG tablet Commonly known as:  TYLENOL Take 2 tablets (650 mg total) by mouth every 4 (four) hours as needed (for pain scale < 4).   cholecalciferol 1000 units tablet Commonly known as:  VITAMIN D Take 1,000 Units by mouth daily.   ibuprofen 600 MG tablet Commonly known as:  ADVIL,MOTRIN Take 1 tablet (600 mg total) by mouth every 6 (six) hours.   prenatal multivitamin Tabs tablet Take 1 tablet by mouth daily at 12 noon.   PROBIOTIC DAILY PO Take 1 capsule by mouth daily.       Diet: routine diet  Activity: Advance as tolerated. Pelvic rest for 6 weeks.   Outpatient follow up:6 weeks Follow up Appt:No future appointments. Follow up Visit:No Follow-up on file.  Postpartum contraception: Natural Family Planning, Condoms and Abstinence  Newborn Data: Live born female  Birth Weight: 8 lb 8.5 oz (3870 g) APGAR: 8, 9  Baby Feeding: Breast Disposition:home with mother   01/09/2016 Ernestina PennaNicholas Macarthur Lorusso, MD   OB FELLOW MEDICAL STUDENT NOTE ATTESTATION  I have seen and examined this patient. Note this is a Psychologist, occupationalmedical student note and  as such does not necessarily reflect the patient's plan of care. Please see Ernestina Pennaicholas Eyob Godlewski MD's note for this date of service.    Ernestina Pennaicholas Jobeth Pangilinan 01/09/2016, 9:42 AM

## 2016-01-09 NOTE — Discharge Summary (Signed)
OB Discharge Summary     Patient Name: Mary Vaughan DOB: 04-14-1985 MRN: [id number]  Date of admission: 01/07/2016 Delivering MD: Thressa ShellerHOGAN, HEATHER D   Date of discharge: 01/09/2016  Admitting diagnosis: 39w labor, ctx varying Intrauterine pregnancy: 8067w5d     Secondary diagnosis:  Active Problems:   Term pregnancy  Additional problems: none     Discharge diagnosis: Term Pregnancy Delivered                                                                                                Post partum procedures:none  Augmentation: none  Complications: None  Hospital course:  Onset of Labor With Vaginal Delivery     31 y.o. yo [id number] at 5367w5d was admitted in Active Labor on 01/07/2016. Patient had an uncomplicated labor course as follows:  Membrane Rupture Time/Date: 10:40 PM ,01/07/2016   Intrapartum Procedures: Episiotomy: None [1]                                         Lacerations:  None [1]  Patient had a delivery of a Viable infant. 01/07/2016  Information for the patient's newborn:  Annamaria HellingWynia, Boy Renarda [[id number]][[id number]]       Pateint had an uncomplicated postpartum course.  She is ambulating, tolerating a regular diet, passing flatus, and urinating well. Patient is discharged home in stable condition on 01/09/16.    Physical exam  Vitals:   01/08/16 0645 01/08/16 1656 01/08/16 2110 01/09/16 0524  BP: (!) 110/53 113/63 118/62 (!) 117/51  Pulse: 67 (!) 59 68 62  Resp:   18 17  Temp: 98.2 F (36.8 C)  97.7 F (36.5 C) 98.3 F (36.8 C)  TempSrc:   Oral Oral  Weight:      Height:       General: alert, cooperative and no distress Lochia: appropriate Uterine Fundus: firm Incision: N/A DVT Evaluation: No evidence of DVT seen on physical exam. Labs: Lab Results  Component Value Date   WBC 10.4 01/07/2016   HGB 13.1 01/07/2016   HCT 38.7 01/07/2016   MCV 88.2 01/07/2016   PLT 224 01/07/2016   No flowsheet data found.  Discharge instruction: per After Visit Summary and  "Baby and Me Booklet".  After visit meds:  Allergies as of 01/09/2016      Reactions   Doxycycline Hives   Penicillins Hives   Has patient had a PCN reaction causing immediate rash, facial/tongue/throat swelling, SOB or lightheadedness with hypotension: No Has patient had a PCN reaction causing severe rash involving mucus membranes or skin necrosis: No Has patient had a PCN reaction that required hospitalization No Has patient had a PCN reaction occurring within the last 10 years: No If all of the above answers are "NO", then may proceed with Cephalosporin use.      Medication List    STOP taking these medications   alum hydroxide-mag trisilicate 80-20 MG Chew chewable tablet Commonly known as:  GAVISCON     TAKE these medications  acetaminophen 325 MG tablet Commonly known as:  TYLENOL Take 2 tablets (650 mg total) by mouth every 4 (four) hours as needed (for pain scale < 4).   cholecalciferol 1000 units tablet Commonly known as:  VITAMIN D Take 1,000 Units by mouth daily.   ibuprofen 600 MG tablet Commonly known as:  ADVIL,MOTRIN Take 1 tablet (600 mg total) by mouth every 6 (six) hours.   prenatal multivitamin Tabs tablet Take 1 tablet by mouth daily at 12 noon.   PROBIOTIC DAILY PO Take 1 capsule by mouth daily.       Diet: routine diet  Activity: Advance as tolerated. Pelvic rest for 6 weeks.   Outpatient follow up:6 weeks Follow up Appt:No future appointments. Follow up Visit:No Follow-up on file.  Postpartum contraception: Natural Family Planning  Newborn Data: Live born female  Birth Weight: 8 lb 8.5 oz (3870 g) APGAR: 8, 9  Baby Feeding: Breast Disposition:home with mother   01/09/2016 Ernestina Penna, MD

## 2016-01-09 NOTE — Discharge Instructions (Signed)

## 2016-01-14 ENCOUNTER — Encounter: Payer: Self-pay | Admitting: Family

## 2016-02-04 ENCOUNTER — Ambulatory Visit: Admitting: Obstetrics and Gynecology

## 2016-02-19 ENCOUNTER — Ambulatory Visit: Admitting: Family

## 2018-10-17 IMAGING — US US MFM OB COMP +14 WKS
1 series · 14 of 28 positions shown · non-contrast
Comparison: none

[Series 1: us mfm ob comp +14 wks · 62 acquisitions, 14 frames shown]
[im 3/62]
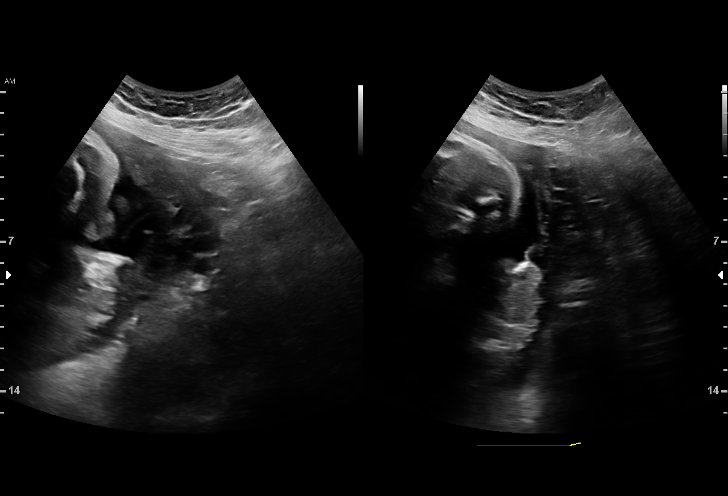
[im 7/62]
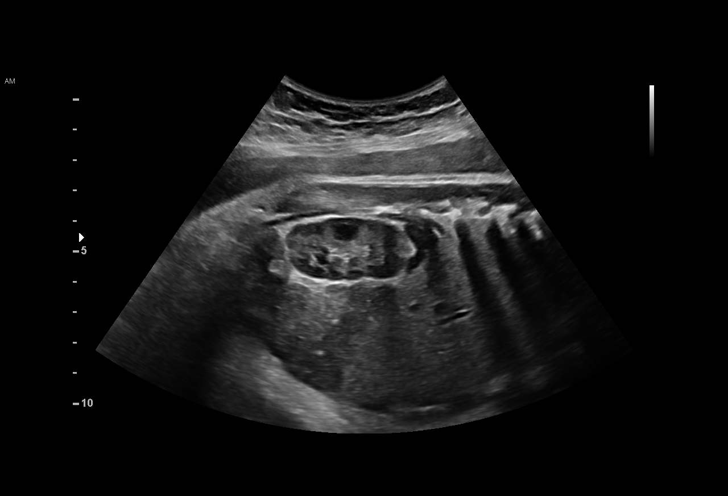
[im 12/62]
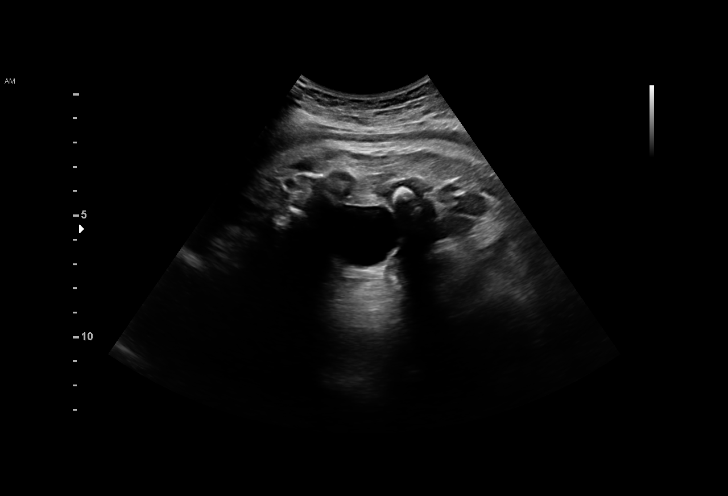
[im 16/62]
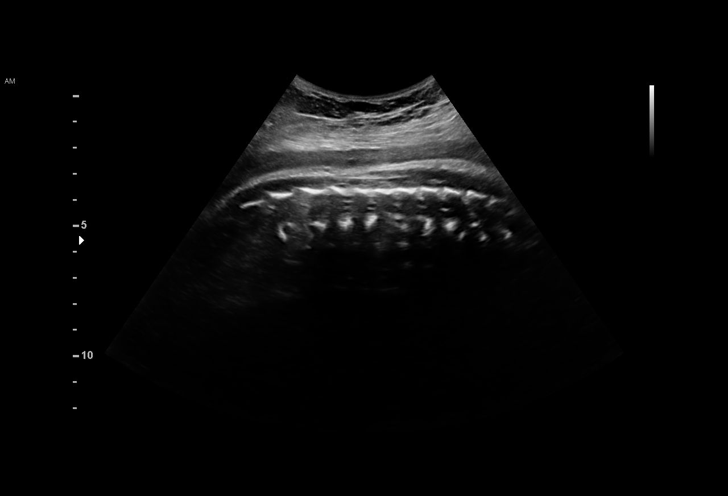
[im 21/62]
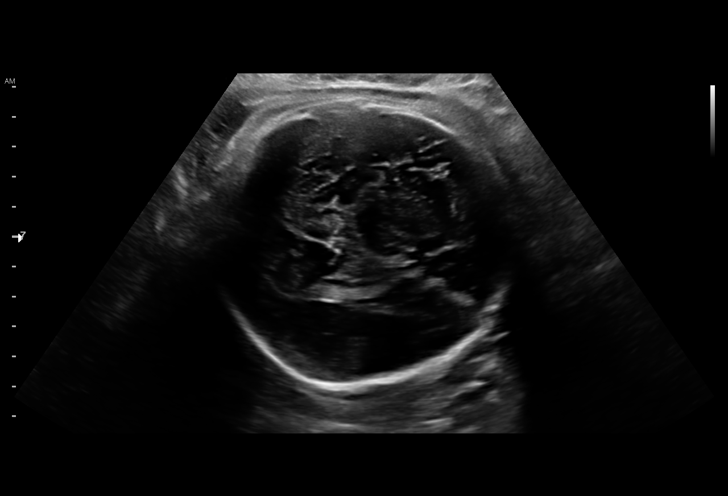
[im 25/62]
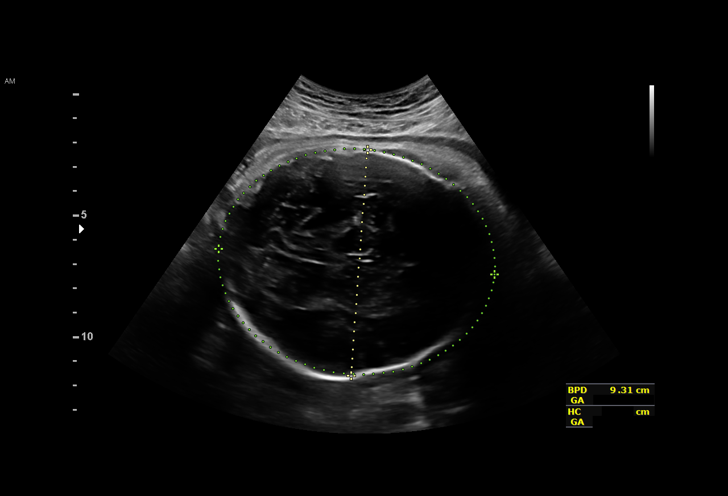
[im 30/62]
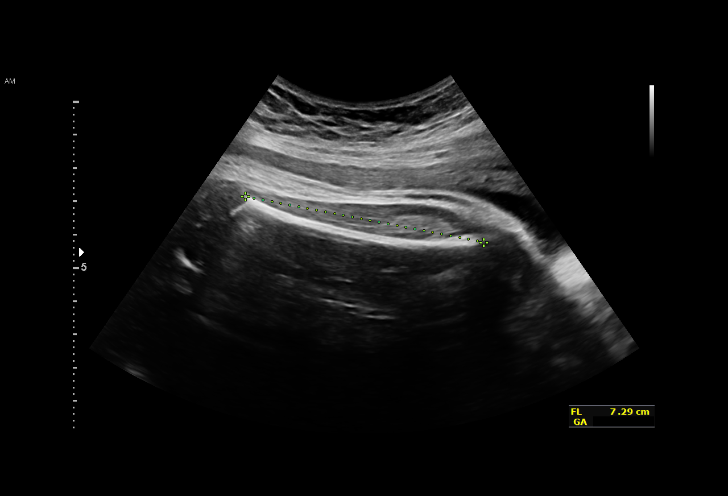
[im 34/62]
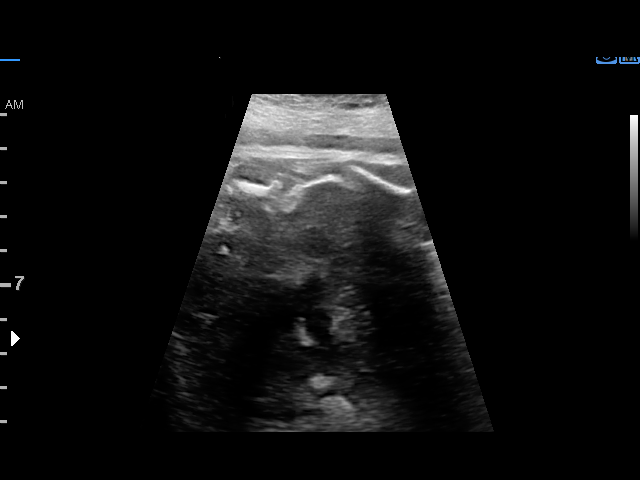
[im 39/62]
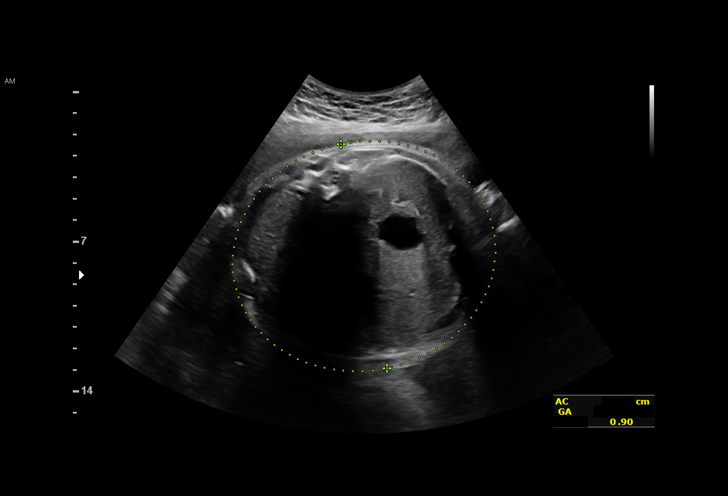
[im 43/62]
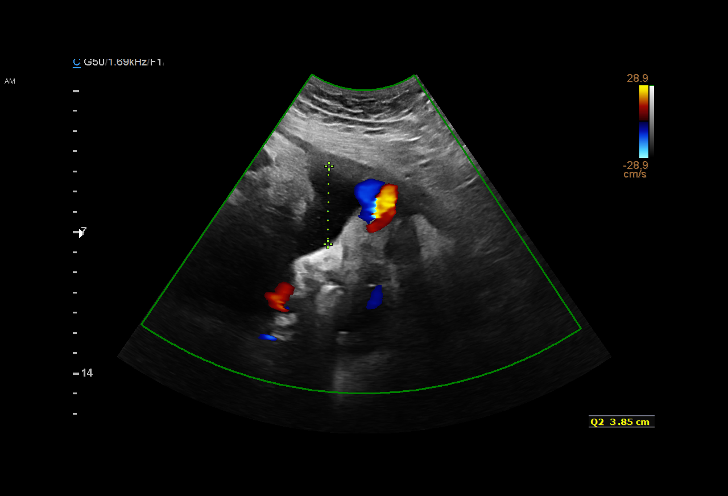
[im 48/62]
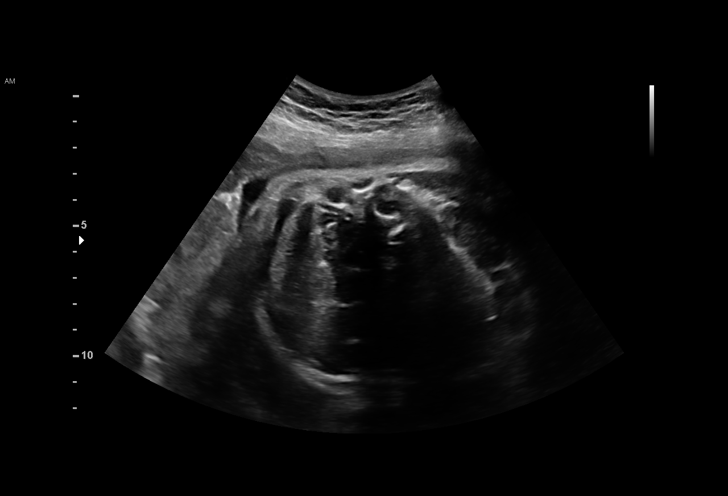
[im 52/62]
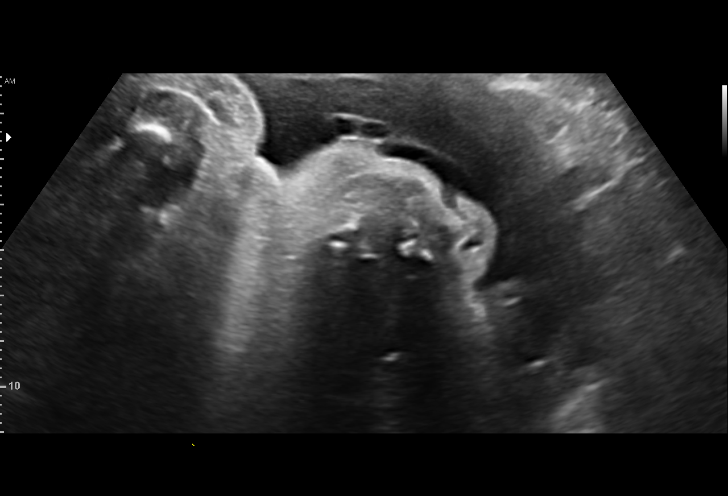
[im 57/62]
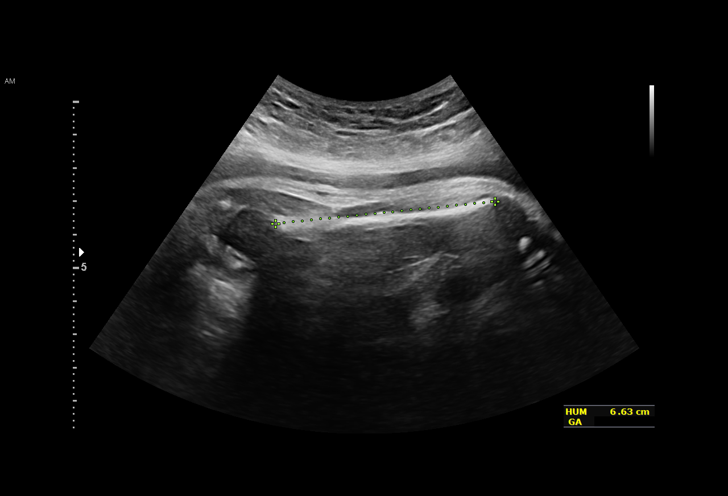
[im 62/62]
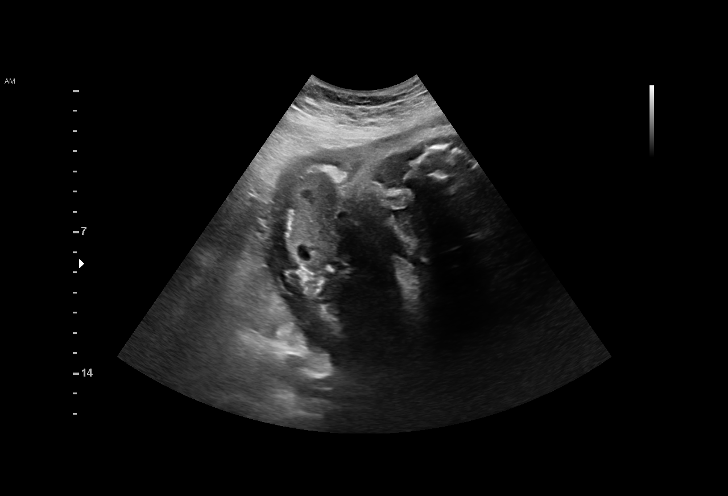

[14 of 28 positions shown; findings below may reference images not displayed]

1  SAN VASC CAETA            [id number]      [id number]     [id number]
Indications

38 weeks gestation of pregnancy
Encounter for antenatal screening for
malformations
OB History

Gravidity:    2         Term:   1        Prem:   0        SAB:   0
TOP:          0       Ectopic:  0        Living: 1
Fetal Evaluation

Num Of Fetuses:     1
Fetal Heart         136
Rate(bpm):
Cardiac Activity:   Observed
Presentation:       Cephalic
Placenta:           Posterior, above cervical os
P. Cord Insertion:  Not well visualized

Amniotic Fluid
AFI FV:      Subjectively within normal limits

AFI Sum(cm)     %Tile       Largest Pocket(cm)
11.08           35

RUQ(cm)       RLQ(cm)       LUQ(cm)        LLQ(cm)
2.3
Biometry

BPD:      93.4  mm     G. Age:  38w 0d         69  %    CI:        77.97   %   70 - 86
FL/HC:      22.0   %   20.9 -
HC:      334.7  mm     G. Age:  38w 2d         33  %    HC/AC:      0.92       0.92 -
AC:      364.8  mm     G. Age:  40w 3d       > 97  %    FL/BPD:     78.8   %   71 - 87
FL:       73.6  mm     G. Age:  37w 5d         38  %    FL/AC:      20.2   %   20 - 24
HUM:      66.3  mm     G. Age:  38w 3d         81  %

Est. FW:    5557  gm      8 lb 4 oz   > 90  %
Gestational Age

U/S Today:     38w 4d                                        EDD:   01/07/16
Best:          38w 2d    Det. By:   Previous Ultrasound      EDD:   01/09/16
(06/05/15)
Anatomy

Cranium:               Appears normal         Aortic Arch:            Appears normal
Cavum:                 Not well visualized    Ductal Arch:            Not well visualized
Ventricles:            Appears normal         Diaphragm:              Appears normal
Choroid Plexus:        Appears normal         Stomach:                Appears normal, left
sided
Cerebellum:            Not well visualized    Abdomen:                Appears normal
Posterior Fossa:       Not well visualized    Abdominal Wall:         Not well visualized
Nuchal Fold:           Not well visualized    Cord Vessels:           Not well visualized
Face:                  Not well visualized    Kidneys:                Appear normal
Lips:                  Appears normal         Bladder:                Appears normal
Thoracic:              Appears normal         Spine:                  Appears normal
Heart:                 Appears normal         Upper Extremities:      Not well visualized
(4CH, axis, and situs
RVOT:                  Appears normal         Lower Extremities:      Not well visualized
LVOT:                  Appears normal

Other:  Fetus appears to be a male. Technically difficult due to advanced GA
and fetal position.
Cervix Uterus Adnexa

Cervix
Not visualized (advanced GA >45wks)

Uterus
No abnormality visualized.

Left Ovary
Not visualized.

Right Ovary
Not visualized.

Adnexa:       No abnormality visualized.
Impression

Singleton intrauterine pregnancy  38 [DATE] weeks on attempted
comprehensive fetal survey:

active singleton fetus
today's biometry demonstrates appropriate interval growth
with EFW at the >90th%'le
no structural defects or markers of aneuploidy demonstrated
limitations as documented above owing to advanced
gestational age
Amniotic fluid volume is appropriate for gestational age by
maximum vertical pocket
placenta is implanted along the posterior uterine wall without
previa
Recommendations

Follow up and management as clinically indicated.
